# Patient Record
Sex: Female | Born: 1980 | Race: White | Hispanic: No | Marital: Married | Smoking: Never smoker
Health system: Southern US, Community
[De-identification: ages and names within clinical notes are randomized; demographics above are authoritative.]

## PROBLEM LIST (undated history)

## (undated) DIAGNOSIS — F40298 Other specified phobia: Secondary | ICD-10-CM

## (undated) DIAGNOSIS — S82001A Unspecified fracture of right patella, initial encounter for closed fracture: Secondary | ICD-10-CM

## (undated) DIAGNOSIS — T07XXXA Unspecified multiple injuries, initial encounter: Secondary | ICD-10-CM

## (undated) HISTORY — PX: SHOULDER ARTHROSCOPY: SHX128

---

## 2003-11-17 ENCOUNTER — Emergency Department (HOSPITAL_COMMUNITY): Admission: EM | Admit: 2003-11-17 | Discharge: 2003-11-17 | Payer: Self-pay | Admitting: Family Medicine

## 2008-03-15 ENCOUNTER — Emergency Department: Payer: Self-pay | Admitting: Emergency Medicine

## 2008-10-17 ENCOUNTER — Encounter: Admission: RE | Admit: 2008-10-17 | Discharge: 2008-11-15 | Payer: Self-pay | Admitting: Orthopedic Surgery

## 2009-04-03 ENCOUNTER — Encounter: Admission: RE | Admit: 2009-04-03 | Discharge: 2009-04-03 | Payer: Self-pay | Admitting: Orthopaedic Surgery
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
## 2012-07-17 ENCOUNTER — Emergency Department: Payer: Self-pay | Admitting: Emergency Medicine

## 2015-07-16 DIAGNOSIS — S82001A Unspecified fracture of right patella, initial encounter for closed fracture: Secondary | ICD-10-CM

## 2015-07-16 HISTORY — DX: Unspecified fracture of right patella, initial encounter for closed fracture: S82.001A

## 2015-08-02 ENCOUNTER — Encounter (HOSPITAL_BASED_OUTPATIENT_CLINIC_OR_DEPARTMENT_OTHER): Payer: Self-pay | Admitting: *Deleted

## 2015-08-02 DIAGNOSIS — T07XXXA Unspecified multiple injuries, initial encounter: Secondary | ICD-10-CM

## 2015-08-02 HISTORY — DX: Unspecified multiple injuries, initial encounter: T07.XXXA

## 2015-08-03 ENCOUNTER — Ambulatory Visit (HOSPITAL_BASED_OUTPATIENT_CLINIC_OR_DEPARTMENT_OTHER): Payer: BLUE CROSS/BLUE SHIELD | Admitting: Anesthesiology

## 2015-08-03 ENCOUNTER — Encounter (HOSPITAL_BASED_OUTPATIENT_CLINIC_OR_DEPARTMENT_OTHER)
Admission: RE | Disposition: A | Discharge status: Home / Self Care | Payer: Self-pay | Source: Ambulatory Visit | Attending: Orthopedic Surgery

## 2015-08-03 ENCOUNTER — Encounter (HOSPITAL_BASED_OUTPATIENT_CLINIC_OR_DEPARTMENT_OTHER): Payer: Self-pay | Admitting: *Deleted

## 2015-08-03 ENCOUNTER — Ambulatory Visit (HOSPITAL_BASED_OUTPATIENT_CLINIC_OR_DEPARTMENT_OTHER)
Admission: RE | Admit: 2015-08-03 | Discharge: 2015-08-03 | Disposition: A | Discharge status: Home / Self Care | Payer: BLUE CROSS/BLUE SHIELD | Source: Ambulatory Visit | Attending: Orthopedic Surgery | Admitting: Orthopedic Surgery

## 2015-08-03 DIAGNOSIS — W19XXXA Unspecified fall, initial encounter: Secondary | ICD-10-CM | POA: Insufficient documentation

## 2015-08-03 DIAGNOSIS — S82001A Unspecified fracture of right patella, initial encounter for closed fracture: Secondary | ICD-10-CM | POA: Diagnosis present

## 2015-08-03 DIAGNOSIS — Y9289 Other specified places as the place of occurrence of the external cause: Secondary | ICD-10-CM | POA: Insufficient documentation

## 2015-08-03 HISTORY — DX: Unspecified fracture of right patella, initial encounter for closed fracture: S82.001A

## 2015-08-03 HISTORY — DX: Unspecified multiple injuries, initial encounter: T07.XXXA

## 2015-08-03 HISTORY — DX: Other specified phobia: F40.298

## 2015-08-03 HISTORY — PX: ORIF PATELLA: SHX5033

## 2015-08-03 SURGERY — OPEN REDUCTION INTERNAL FIXATION (ORIF) PATELLA
Anesthesia: General | Site: Knee | Laterality: Right

## 2015-08-03 MED ORDER — OXYCODONE HCL 5 MG PO TABS
5.0000 mg | ORAL_TABLET | Freq: Once | ORAL | Status: AC
  Administered 2015-08-03: 5 mg via ORAL

## 2015-08-03 MED ORDER — DEXAMETHASONE SODIUM PHOSPHATE 10 MG/ML IJ SOLN
INTRAMUSCULAR | Status: DC | PRN
  Administered 2015-08-03: 10 mg via INTRAVENOUS

## 2015-08-03 MED ORDER — MEPERIDINE HCL 25 MG/ML IJ SOLN: 6.2500 mg | INTRAMUSCULAR | Status: DC | PRN

## 2015-08-03 MED ORDER — GLYCOPYRROLATE 0.2 MG/ML IJ SOLN: 0.2000 mg | Freq: Once | INTRAMUSCULAR | Status: DC | PRN

## 2015-08-03 MED ORDER — CEFAZOLIN SODIUM-DEXTROSE 2-3 GM-% IV SOLR
INTRAVENOUS | Status: AC
  Filled 2015-08-03: qty 50

## 2015-08-03 MED ORDER — PROPOFOL 10 MG/ML IV BOLUS
INTRAVENOUS | Status: DC | PRN
  Administered 2015-08-03: 200 mg via INTRAVENOUS

## 2015-08-03 MED ORDER — HYDROMORPHONE HCL 1 MG/ML IJ SOLN
INTRAMUSCULAR | Status: AC
  Filled 2015-08-03: qty 1

## 2015-08-03 MED ORDER — LIDOCAINE HCL (CARDIAC) 20 MG/ML IV SOLN
INTRAVENOUS | Status: DC | PRN
  Administered 2015-08-03: 80 mg via INTRAVENOUS

## 2015-08-03 MED ORDER — SCOPOLAMINE 1 MG/3DAYS TD PT72: 1.0000 | MEDICATED_PATCH | Freq: Once | TRANSDERMAL | Status: DC | PRN

## 2015-08-03 MED ORDER — BUPIVACAINE HCL (PF) 0.5 % IJ SOLN
INTRAMUSCULAR | Status: AC
  Filled 2015-08-03: qty 30

## 2015-08-03 MED ORDER — BUPIVACAINE-EPINEPHRINE (PF) 0.5% -1:200000 IJ SOLN
INTRAMUSCULAR | Status: AC
  Filled 2015-08-03: qty 30

## 2015-08-03 MED ORDER — LACTATED RINGERS IV SOLN: INTRAVENOUS | Status: DC

## 2015-08-03 MED ORDER — MIDAZOLAM HCL 2 MG/2ML IJ SOLN
1.0000 mg | INTRAMUSCULAR | Status: DC | PRN
  Administered 2015-08-03 (×2): 2 mg via INTRAVENOUS

## 2015-08-03 MED ORDER — DIAZEPAM 5 MG PO TABS: 5.0000 mg | ORAL_TABLET | Freq: Four times a day (QID) | ORAL | Status: DC | PRN

## 2015-08-03 MED ORDER — FENTANYL CITRATE (PF) 100 MCG/2ML IJ SOLN
INTRAMUSCULAR | Status: AC
  Filled 2015-08-03: qty 2

## 2015-08-03 MED ORDER — ONDANSETRON HCL 4 MG/2ML IJ SOLN
INTRAMUSCULAR | Status: DC | PRN
  Administered 2015-08-03: 4 mg via INTRAVENOUS

## 2015-08-03 MED ORDER — CEFAZOLIN SODIUM-DEXTROSE 2-3 GM-% IV SOLR
2.0000 g | INTRAVENOUS | Status: AC
  Administered 2015-08-03: 2 g via INTRAVENOUS

## 2015-08-03 MED ORDER — 0.9 % SODIUM CHLORIDE (POUR BTL) OPTIME
TOPICAL | Status: DC | PRN
  Administered 2015-08-03: 120 mL

## 2015-08-03 MED ORDER — BUPIVACAINE-EPINEPHRINE (PF) 0.5% -1:200000 IJ SOLN
INTRAMUSCULAR | Status: DC | PRN
  Administered 2015-08-03: 20 mL

## 2015-08-03 MED ORDER — PROMETHAZINE HCL 25 MG/ML IJ SOLN: 6.2500 mg | INTRAMUSCULAR | Status: DC | PRN

## 2015-08-03 MED ORDER — HYDROMORPHONE HCL 1 MG/ML IJ SOLN
0.2500 mg | INTRAMUSCULAR | Status: DC | PRN
  Administered 2015-08-03 (×4): 0.5 mg via INTRAVENOUS

## 2015-08-03 MED ORDER — OXYCODONE HCL 5 MG PO TABS
ORAL_TABLET | ORAL | Status: AC
  Filled 2015-08-03: qty 1

## 2015-08-03 MED ORDER — MIDAZOLAM HCL 2 MG/2ML IJ SOLN
INTRAMUSCULAR | Status: AC
  Filled 2015-08-03: qty 2

## 2015-08-03 MED ORDER — LACTATED RINGERS IV SOLN
INTRAVENOUS | Status: DC
  Administered 2015-08-03 (×2): via INTRAVENOUS

## 2015-08-03 MED ORDER — OXYCODONE HCL 5 MG PO TABS: 5.0000 mg | ORAL_TABLET | Freq: Once | ORAL | Status: DC

## 2015-08-03 MED ORDER — HYDROMORPHONE HCL 2 MG PO TABS
2.0000 mg | ORAL_TABLET | ORAL | Status: DC | PRN
Start: 2015-08-03 — End: 2018-11-28

## 2015-08-03 MED ORDER — SENNA-DOCUSATE SODIUM 8.6-50 MG PO TABS: 2.0000 | ORAL_TABLET | Freq: Every day | ORAL | Status: DC

## 2015-08-03 MED ORDER — BACLOFEN 10 MG PO TABS: 10.0000 mg | ORAL_TABLET | Freq: Three times a day (TID) | ORAL | Status: DC

## 2015-08-03 MED ORDER — FENTANYL CITRATE (PF) 100 MCG/2ML IJ SOLN
50.0000 ug | INTRAMUSCULAR | Status: AC | PRN
  Administered 2015-08-03: 25 ug via INTRAVENOUS
  Administered 2015-08-03: 50 ug via INTRAVENOUS
  Administered 2015-08-03: 100 ug via INTRAVENOUS
  Administered 2015-08-03: 50 ug via INTRAVENOUS
  Administered 2015-08-03: 25 ug via INTRAVENOUS

## 2015-08-03 MED ORDER — ONDANSETRON HCL 4 MG PO TABS: 4.0000 mg | ORAL_TABLET | Freq: Three times a day (TID) | ORAL | Status: DC | PRN

## 2015-08-03 MED ORDER — PROPOFOL 500 MG/50ML IV EMUL
INTRAVENOUS | Status: AC
  Filled 2015-08-03: qty 50

## 2015-08-03 MED ORDER — OXYCODONE-ACETAMINOPHEN 10-325 MG PO TABS: 1.0000 | ORAL_TABLET | Freq: Four times a day (QID) | ORAL | Status: DC | PRN

## 2015-08-03 MED ORDER — FENTANYL CITRATE (PF) 100 MCG/2ML IJ SOLN
INTRAMUSCULAR | Status: AC
  Filled 2015-08-03: qty 4

## 2015-08-03 SURGICAL SUPPLY — 76 items
BANDAGE ELASTIC 4 VELCRO ST LF (GAUZE/BANDAGES/DRESSINGS) ×3 IMPLANT
BANDAGE ELASTIC 6 VELCRO ST LF (GAUZE/BANDAGES/DRESSINGS) ×3 IMPLANT
BANDAGE ESMARK 6X9 LF (GAUZE/BANDAGES/DRESSINGS) ×1 IMPLANT
BIT DRILL CANN 2.7X625 NONSTRL (BIT) ×3 IMPLANT
BLADE SURG 15 STRL LF DISP TIS (BLADE) ×2 IMPLANT
BLADE SURG 15 STRL SS (BLADE) ×4
BNDG ESMARK 6X9 LF (GAUZE/BANDAGES/DRESSINGS) ×3
BNDG GAUZE ELAST 4 BULKY (GAUZE/BANDAGES/DRESSINGS) ×3 IMPLANT
CANISTER SUCT 1200ML W/VALVE (MISCELLANEOUS) ×3 IMPLANT
CLOSURE STERI-STRIP 1/2X4 (GAUZE/BANDAGES/DRESSINGS) ×1
CLSR STERI-STRIP ANTIMIC 1/2X4 (GAUZE/BANDAGES/DRESSINGS) ×2 IMPLANT
CUFF TOURNIQUET SINGLE 34IN LL (TOURNIQUET CUFF) ×3 IMPLANT
DECANTER SPIKE VIAL GLASS SM (MISCELLANEOUS) IMPLANT
DRAPE C-ARM 42X72 X-RAY (DRAPES) IMPLANT
DRAPE C-ARMOR (DRAPES) IMPLANT
DRAPE EXTREMITY T 121X128X90 (DRAPE) ×3 IMPLANT
DRAPE INCISE IOBAN 66X45 STRL (DRAPES) IMPLANT
DRAPE OEC MINIVIEW 54X84 (DRAPES) ×3 IMPLANT
DRAPE U-SHAPE 47X51 STRL (DRAPES) ×3 IMPLANT
DRSG PAD ABDOMINAL 8X10 ST (GAUZE/BANDAGES/DRESSINGS) ×15 IMPLANT
DURAPREP 26ML APPLICATOR (WOUND CARE) ×3 IMPLANT
ELECT REM PT RETURN 9FT ADLT (ELECTROSURGICAL) ×3
ELECTRODE REM PT RTRN 9FT ADLT (ELECTROSURGICAL) ×1 IMPLANT
GAUZE SPONGE 4X4 12PLY STRL (GAUZE/BANDAGES/DRESSINGS) ×3 IMPLANT
GLOVE BIO SURGEON STRL SZ8 (GLOVE) ×3 IMPLANT
GLOVE BIOGEL M STRL SZ7.5 (GLOVE) ×3 IMPLANT
GLOVE BIOGEL PI IND STRL 8 (GLOVE) ×3 IMPLANT
GLOVE BIOGEL PI INDICATOR 8 (GLOVE) ×6
GLOVE EXAM NITRILE MD LF STRL (GLOVE) ×3 IMPLANT
GLOVE ORTHO TXT STRL SZ7.5 (GLOVE) ×3 IMPLANT
GOWN STRL REUS W/ TWL LRG LVL3 (GOWN DISPOSABLE) IMPLANT
GOWN STRL REUS W/ TWL XL LVL3 (GOWN DISPOSABLE) ×3 IMPLANT
GOWN STRL REUS W/TWL LRG LVL3 (GOWN DISPOSABLE)
GOWN STRL REUS W/TWL XL LVL3 (GOWN DISPOSABLE) ×6
GUIDEWARE NON THREAD 1.25X150 (WIRE) ×6
GUIDEWIRE NON THREAD 1.25X150 (WIRE) ×2 IMPLANT
IMMOBILIZER KNEE 22 UNIV (SOFTGOODS) IMPLANT
IMMOBILIZER KNEE 24 THIGH 36 (MISCELLANEOUS) IMPLANT
IMMOBILIZER KNEE 24 UNIV (MISCELLANEOUS)
NEEDLE KEITH (NEEDLE) ×3 IMPLANT
NS IRRIG 1000ML POUR BTL (IV SOLUTION) ×3 IMPLANT
PACK ARTHROSCOPY DSU (CUSTOM PROCEDURE TRAY) ×3 IMPLANT
PACK BASIN DAY SURGERY FS (CUSTOM PROCEDURE TRAY) ×3 IMPLANT
PAD CAST 4YDX4 CTTN HI CHSV (CAST SUPPLIES) ×1 IMPLANT
PADDING CAST ABS 4INX4YD NS (CAST SUPPLIES) ×10
PADDING CAST ABS 6INX4YD NS (CAST SUPPLIES) ×4
PADDING CAST ABS COTTON 4X4 ST (CAST SUPPLIES) ×5 IMPLANT
PADDING CAST ABS COTTON 6X4 NS (CAST SUPPLIES) ×2 IMPLANT
PADDING CAST COTTON 4X4 STRL (CAST SUPPLIES) ×2
PENCIL BUTTON HOLSTER BLD 10FT (ELECTRODE) ×3 IMPLANT
SCREW CANN L THRD/30 4.0 (Screw) ×6 IMPLANT
SLEEVE SCD COMPRESS KNEE MED (MISCELLANEOUS) ×3 IMPLANT
SPLINT FAST PLASTER 5X30 (CAST SUPPLIES) ×60
SPLINT PLASTER CAST FAST 5X30 (CAST SUPPLIES) ×30 IMPLANT
SPONGE LAP 18X18 X RAY DECT (DISPOSABLE) ×3 IMPLANT
STAPLER VISISTAT 35W (STAPLE) IMPLANT
SUCTION FRAZIER TIP 10 FR DISP (SUCTIONS) ×3 IMPLANT
SUT ETHILON 3 0 PS 1 (SUTURE) IMPLANT
SUT ETHILON 4 0 PS 2 18 (SUTURE) IMPLANT
SUT FIBERWIRE #2 38 T-5 BLUE (SUTURE) ×6
SUT FIBERWIRE #5 38 CONV NDL (SUTURE)
SUT MNCRL AB 4-0 PS2 18 (SUTURE) IMPLANT
SUT VIC AB 0 CT1 27 (SUTURE) ×2
SUT VIC AB 0 CT1 27XBRD ANBCTR (SUTURE) ×1 IMPLANT
SUT VIC AB 2-0 SH 18 (SUTURE) IMPLANT
SUT VIC AB 2-0 SH 27 (SUTURE)
SUT VIC AB 2-0 SH 27XBRD (SUTURE) IMPLANT
SUT VIC AB 3-0 SH 27 (SUTURE)
SUT VIC AB 3-0 SH 27X BRD (SUTURE) IMPLANT
SUT VICRYL 3-0 CR8 SH (SUTURE) ×3 IMPLANT
SUT VICRYL 4-0 PS2 18IN ABS (SUTURE) IMPLANT
SUTURE FIBERWR #2 38 T-5 BLUE (SUTURE) ×2 IMPLANT
SUTURE FIBERWR #5 38 CONV NDL (SUTURE) IMPLANT
SYR BULB 3OZ (MISCELLANEOUS) ×3 IMPLANT
UNDERPAD 30X30 (UNDERPADS AND DIAPERS) ×3 IMPLANT
YANKAUER SUCT BULB TIP NO VENT (SUCTIONS) IMPLANT

## 2015-08-03 NOTE — Progress Notes (Signed)
Assisted Dr. Hollis with right, ultrasound guided, femoral block. Side rails up, monitors on throughout procedure. See vital signs in flow sheet. Tolerated Procedure well. 

## 2015-08-03 NOTE — Transfer of Care (Signed)
Immediate Anesthesia Transfer of Care Note  Patient: Mary Schneider  Procedure(s) Performed: Procedure(s): OPEN REDUCTION INTERNAL (ORIF) FIXATION RIGHT PATELLA (Right)  Patient Location: PACU  Anesthesia Type:GA combined with regional for post-op pain  Level of Consciousness: sedated  Airway & Oxygen Therapy: Patient Spontanous Breathing and Patient connected to face mask oxygen  Post-op Assessment: Report given to RN and Post -op Vital signs reviewed and stable  Post vital signs: Reviewed and stable  Last Vitals:  Filed Vitals:   08/03/15 1415  BP:   Pulse: 80  Temp:   Resp: 38    Complications: No apparent anesthesia complications

## 2015-08-03 NOTE — H&P (Signed)
PREOPERATIVE H&P  Chief Complaint: Right knee pain  HPI: Mary Schneider is a 34 y.o. female who presents for preoperative history and physical with a diagnosis of Right fracture of patella, initial encounter for closed fracture S82.009A. Symptoms are rated as moderate to severe, and have been worsening.  This is significantly impairing activities of daily living.  She has elected for surgical management. This happened after she had a fall, fell directly onto the anterior knee. Unable left leg. Significant pain. Denies any other major injuries. This was after getting her nails done. She was apparently outside the store and tripped on a broken piece of pavement.  Past Medical History  Diagnosis Date  . Right patella fracture 07/2015  . Needle phobia   . Abrasions of multiple sites 08/02/2015    left knee, left ankle   Past Surgical History  Procedure Laterality Date  . Shoulder arthroscopy Right    Social History   Social History  . Marital Status: Married    Spouse Name: N/A  . Number of Children: N/A  . Years of Education: N/A   Social History Main Topics  . Smoking status: Never Smoker   . Smokeless tobacco: Never Used  . Alcohol Use: No  . Drug Use: No  . Sexual Activity: Not Asked   Other Topics Concern  . None   Social History Narrative  . None   History reviewed. No pertinent family history. No Known Allergies Prior to Admission medications   Medication Sig Start Date End Date Taking? Authorizing Provider  oxyCODONE-acetaminophen (PERCOCET) 10-325 MG per tablet Take 1 tablet by mouth every 4 (four) hours as needed for pain.   Yes Historical Provider, MD     Positive ROS: All other systems have been reviewed and were otherwise negative with the exception of those mentioned in the HPI and as above.  Physical Exam: General: Alert, no acute distress Cardiovascular: No pedal edema Respiratory: No cyanosis, no use of accessory musculature GI: No organomegaly,  abdomen is soft and non-tender Skin: No lesions in the area of chief complaint Neurologic: Sensation intact distally Psychiatric: Patient is competent for consent with normal mood and affect Lymphatic: No axillary or cervical lymphadenopathy  MUSCULOSKELETAL: Right leg has positive pain to palpation over the patella, with complete loss of extensor mechanism. Sensation intact distally.  Assessment: Acute displaced right patella fracture   Plan: Plan for Procedure(s): OPEN REDUCTION INTERNAL (ORIF) FIXATION RIGHT PATELLA  The risks benefits and alternatives were discussed with the patient including but not limited to the risks of nonoperative treatment, versus surgical intervention including infection, bleeding, nerve injury, malunion, nonunion, the need for revision surgery, hardware prominence, hardware failure, the need for hardware removal, blood clots, cardiopulmonary complications, morbidity, mortality, among others, and they were willing to proceed.     Eulas Post, MD Cell [phone]   08/03/2015 10:14 AM

## 2015-08-03 NOTE — Discharge Instructions (Signed)
Diet: As you were doing prior to hospitalization   Shower:  May shower but keep the wounds dry, use an occlusive plastic wrap, NO SOAKING IN TUB.  If the bandage gets wet, change with a clean dry gauze.  If you have a splint on, leave the splint in place and keep the splint dry with a plastic bag.  Dressing:  You may change your dressing 3-5 days after surgery, unless you have a splint.  If you have a splint, then just leave the splint in place and we will change your bandages during your first follow-up appointment.    If you had hand or foot surgery, we will plan to remove your stitches in about 2 weeks in the office.  For all other surgeries, there are sticky tapes (steri-strips) on your wounds and all the stitches are absorbable.  Leave the steri-strips in place when changing your dressings, they will peel off with time, usually 2-3 weeks.  Activity:  Increase activity slowly as tolerated, but follow the weight bearing instructions below.  The rules on driving is that you can not be taking narcotics while you drive, and you must feel in control of the vehicle.    Weight Bearing:   Touch toe weight bearing, keep leg in full extension..    To prevent constipation: you may use a stool softener such as -  Colace (over the counter) 100 mg by mouth twice a day  Drink plenty of fluids (prune juice may be helpful) and high fiber foods Miralax (over the counter) for constipation as needed.    Itching:  If you experience itching with your medications, try taking only a single pain pill, or even half a pain pill at a time.  You may take up to 10 pain pills per day, and you can also use benadryl over the counter for itching or also to help with sleep.   Precautions:  If you experience chest pain or shortness of breath - call 911 immediately for transfer to the hospital emergency department!!  If you develop a fever greater that 101 F, purulent drainage from wound, increased redness or drainage from  wound, or calf pain -- Call the office at [phone]                                                Follow- Up Appointment:  Please call for an appointment to be seen in 2 weeks [place] - [phone]      Post Anesthesia Home Care Instructions  Activity: Get plenty of rest for the remainder of the day. A responsible adult should stay with you for 24 hours following the procedure.  For the next 24 hours, DO NOT: -Drive a car -Advertising copywriter -Drink alcoholic beverages -Take any medication unless instructed by your physician -Make any legal decisions or sign important papers.  Meals: Start with liquid foods such as gelatin or soup. Progress to regular foods as tolerated. Avoid greasy, spicy, heavy foods. If nausea and/or vomiting occur, drink only clear liquids until the nausea and/or vomiting subsides. Call your physician if vomiting continues.  Special Instructions/Symptoms: Your throat may feel dry or sore from the anesthesia or the breathing tube placed in your throat during surgery. If this causes discomfort, gargle with warm salt water. The discomfort should disappear within 24 hours.  If you had a scopolamine patch  placed behind your ear for the management of post- operative nausea and/or vomiting:  1. The medication in the patch is effective for 72 hours, after which it should be removed.  Wrap patch in a tissue and discard in the trash. Wash hands thoroughly with soap and water. 2. You may remove the patch earlier than 72 hours if you experience unpleasant side effects which may include dry mouth, dizziness or visual disturbances. 3. Avoid touching the patch. Wash your hands with soap and water after contact with the patch.    Regional Anesthesia Blocks  1. Numbness or the inability to move the "blocked" extremity may last from 3-48 hours after placement. The length of time depends on the medication injected and your individual response to the medication. If the  numbness is not going away after 48 hours, call your surgeon.  2. The extremity that is blocked will need to be protected until the numbness is gone and the  Strength has returned. Because you cannot feel it, you will need to take extra care to avoid injury. Because it may be weak, you may have difficulty moving it or using it. You may not know what position it is in without looking at it while the block is in effect.  3. For blocks in the legs and feet, returning to weight bearing and walking needs to be done carefully. You will need to wait until the numbness is entirely gone and the strength has returned. You should be able to move your leg and foot normally before you try and bear weight or walk. You will need someone to be with you when you first try to ensure you do not fall and possibly risk injury.  4. Bruising and tenderness at the needle site are common side effects and will resolve in a few days.  5. Persistent numbness or new problems with movement should be communicated to the surgeon or the [facility] [phone] [facility] [phone]).

## 2015-08-03 NOTE — Anesthesia Preprocedure Evaluation (Addendum)
Anesthesia Evaluation  Patient identified by MRN, date of birth, ID band Patient awake    Reviewed: Allergy & Precautions, NPO status , Patient's Chart, lab work & pertinent test results  Airway Mallampati: II  TM Distance: >3 FB Neck ROM: Full    Dental  (+) Teeth Intact   Pulmonary neg pulmonary ROS,  breath sounds clear to auscultation        Cardiovascular Exercise Tolerance: Good Rhythm:Regular Rate:Normal     Neuro/Psych PSYCHIATRIC DISORDERS Anxiety negative neurological ROS     GI/Hepatic negative GI ROS, Neg liver ROS,   Endo/Other  negative endocrine ROS  Renal/GU negative Renal ROS  negative genitourinary   Musculoskeletal negative musculoskeletal ROS (+)   Abdominal   Peds negative pediatric ROS (+)  Hematology negative hematology ROS (+)   Anesthesia Other Findings   Reproductive/Obstetrics negative OB ROS                            Anesthesia Physical Anesthesia Plan  ASA: I  Anesthesia Plan: General   Post-op Pain Management: GA combined w/ Regional for post-op pain   Induction: Intravenous  Airway Management Planned: LMA  Additional Equipment:   Intra-op Plan:   Post-operative Plan: Extubation in OR  Informed Consent: I have reviewed the patients History and Physical, chart, labs and discussed the procedure including the risks, benefits and alternatives for the proposed anesthesia with the patient or authorized representative who has indicated his/her understanding and acceptance.   Dental advisory given  Plan Discussed with: CRNA  Anesthesia Plan Comments:         Anesthesia Quick Evaluation

## 2015-08-03 NOTE — Anesthesia Procedure Notes (Addendum)
Anesthesia Regional Block:  Femoral nerve block  Pre-Anesthetic Checklist: ,, timeout performed, Correct Patient, Correct Site, Correct Laterality, Correct Procedure, Correct Position, site marked, Risks and benefits discussed,  Surgical consent,  Pre-op evaluation,  At surgeon's request and post-op pain management  Laterality: Right  Prep: chloraprep       Needles:  Injection technique: Single-shot  Needle Type: Echogenic Needle     Needle Length: 9cm 9 cm Needle Gauge: 21 and 21 G    Additional Needles:  Procedures: ultrasound guided (picture in chart) Femoral nerve block Narrative:  Start time: 08/03/2015 7:15 AM End time: 08/03/2015 7:21 AM Injection made incrementally with aspirations every 5 mL.  Performed by: Personally  Anesthesiologist: Cyndie Chime   Procedure Name: LMA Insertion Date/Time: 08/03/2015 12:23 PM Performed by: Burna Cash Pre-anesthesia Checklist: Patient identified, Emergency Drugs available, Suction available and Patient being monitored Patient Re-evaluated:Patient Re-evaluated prior to inductionOxygen Delivery Method: Circle System Utilized Preoxygenation: Pre-oxygenation with 100% oxygen Intubation Type: IV induction Ventilation: Mask ventilation without difficulty LMA: LMA inserted LMA Size: 4.0 Number of attempts: 1 Airway Equipment and Method: Bite block Placement Confirmation: positive ETCO2 Tube secured with: Tape Dental Injury: Teeth and Oropharynx as per pre-operative assessment

## 2015-08-03 NOTE — Op Note (Signed)
08/03/2015  2:02 PM  PATIENT:  Mary Schneider    PRE-OPERATIVE DIAGNOSIS:  Displaced right patella fracture, closed  POST-OPERATIVE DIAGNOSIS:  Same  PROCEDURE:  OPEN REDUCTION INTERNAL (ORIF) FIXATION RIGHT PATELLA  SURGEON:  Eulas Post, MD  PHYSICIAN ASSISTANT: Janace Litten, OPA-C, present and scrubbed throughout the case, critical for completion in a timely fashion, and for retraction, instrumentation, and closure.  ANESTHESIA:   General  PREOPERATIVE INDICATIONS:  Mary Schneider is a  34 y.o. female who fell outside of a nail salon on a broken piece of concrete and had a displaced right patella fracture, and elected for surgical management in order to restore the function of the extensor mechanism.    The risks benefits and alternatives were discussed with the patient preoperatively including but not limited to the risks of infection, bleeding, nerve injury, cardiopulmonary complications, the need for revision surgery, hardware prominence, hardware failure, the need for hardware removal, nonunion, malunion, posttraumatic arthritis, stiffness, loss of strength and function, among others, and the patient was willing to proceed.  OPERATIVE IMPLANTS: 4.0 mm cannulated screws x2 with a total of 2 #2 FiberWire going through the cannulated screws in a figure-of-eight cerclage fashion  OPERATIVE FINDINGS: Displaced patella fracture  OPERATIVE PROCEDURE: The patient was brought to the operating room and placed in the supine position. General anesthesia was administered. IV antibiotics were given. The lower extremity was prepped and draped in usual sterile fashion. The leg was elevated and exsanguinated and the tourniquet was inflated. Time out was performed.   Anterior incision was made over the patella and the fracture fragments identified and cleaned of hematoma. The retinaculum was actually not torn. There was a sagittal split, as well as a transverse split. I held each fracture  plane with a different clamp.  I placed 2 guidewires for the cannulated screws.  The lengths were measured, after being confirmed on C-arm, and then I placed the screws, taking care to make sure that there were threads only on the proximal segment, providing compression at the fracture site, and the tips were not prominent proximally.  C-arm used to confirm reduction and position of the screws, and once I was satisfied with this I then used a Keith needle through the screws bringing a total of 2 #2 FiberWire in a figure-of-eight type fashion. This provided excellent secondary fixation. I left the screw lengths slightly short of the far cortex in order to minimize the risk for rupture of the FiberWire over the tip of the screws.  The anterior patellar tissue was repaired with Vicryl, again there was no significant retinacular tearing.   The wounds were irrigated copiously and the retinaculum repaired with Vicryl followed by Vicryl for the subcutaneous tissue with Steri-Strips and sterile gauze for the skin. The wounds were also injectea long leg splint was applied, and the patient was awakened and returned to the PACU in stable and satisfactory condition.There were no complications.

## 2015-08-03 NOTE — Anesthesia Postprocedure Evaluation (Signed)
  Anesthesia Post-op Note  Patient: Mary Schneider  Procedure(s) Performed: Procedure(s): OPEN REDUCTION INTERNAL (ORIF) FIXATION RIGHT PATELLA (Right)  Patient Location: PACU  Anesthesia Type:General  Level of Consciousness: awake and alert   Airway and Oxygen Therapy: Patient Spontanous Breathing  Post-op Pain: moderate  Post-op Assessment: Post-op Vital signs reviewed and Patient's Cardiovascular Status Stable     RLE Motor Response: Purposeful movement RLE Sensation: Numbness      Post-op Vital Signs: Reviewed and stable  Last Vitals:  Filed Vitals:   08/03/15 1505  BP: 130/93  Pulse: 95  Temp: 37 C  Resp: 14    Complications: No apparent anesthesia complications

## 2015-08-07 ENCOUNTER — Encounter (HOSPITAL_BASED_OUTPATIENT_CLINIC_OR_DEPARTMENT_OTHER): Payer: Self-pay | Admitting: Orthopedic Surgery

## 2018-11-28 ENCOUNTER — Encounter: Payer: Self-pay | Admitting: Emergency Medicine

## 2018-11-28 ENCOUNTER — Ambulatory Visit (INDEPENDENT_AMBULATORY_CARE_PROVIDER_SITE_OTHER): Payer: BLUE CROSS/BLUE SHIELD

## 2018-11-28 ENCOUNTER — Ambulatory Visit
Admission: EM | Admit: 2018-11-28 | Discharge: 2018-11-28 | Disposition: A | Discharge status: Home / Self Care | Payer: BLUE CROSS/BLUE SHIELD | Attending: Emergency Medicine | Admitting: Emergency Medicine

## 2018-11-28 ENCOUNTER — Other Ambulatory Visit: Payer: Self-pay

## 2018-11-28 DIAGNOSIS — S2242XA Multiple fractures of ribs, left side, initial encounter for closed fracture: Secondary | ICD-10-CM | POA: Insufficient documentation

## 2018-11-28 DIAGNOSIS — R071 Chest pain on breathing: Secondary | ICD-10-CM | POA: Diagnosis not present

## 2018-11-28 MED ORDER — ACETAMINOPHEN 500 MG PO TABS
1000.0000 mg | ORAL_TABLET | Freq: Once | ORAL | Status: AC
  Administered 2018-11-28: 1000 mg via ORAL

## 2018-11-28 MED ORDER — HYDROCODONE-ACETAMINOPHEN 5-325 MG PO TABS: 1.0000 | ORAL_TABLET | Freq: Four times a day (QID) | ORAL | 0 refills | Status: AC | PRN

## 2018-11-28 MED ORDER — IBUPROFEN 600 MG PO TABS: 600.0000 mg | ORAL_TABLET | Freq: Four times a day (QID) | ORAL | 0 refills | Status: AC | PRN

## 2018-11-28 MED ORDER — KETOROLAC TROMETHAMINE 60 MG/2ML IM SOLN
30.0000 mg | Freq: Once | INTRAMUSCULAR | Status: AC
  Administered 2018-11-28: 30 mg via INTRAMUSCULAR

## 2018-11-28 NOTE — Discharge Instructions (Signed)
600 mg of ibuprofen combined with [place] containing product 3 or 4 times a day as needed for pain.  Either the ibuprofen/1000 mg of regular Tylenol for mild to moderate pain or ibuprofen/1-2 Norco for severe pain.  Do not take the Tylenol and Norco as they both have Tylenol in them and too much can hurt your liver.  This will take 4 to 6 weeks to completely heal.  Use the incentive spirometer as often as you remember to do so to help prevent a pneumonia.

## 2018-11-28 NOTE — ED Provider Notes (Signed)
HPI  SUBJECTIVE:  Mary Schneider is a 37 y.o. female who presents with sharp intermittent left-sided rib pain for the past week.  States that she was playing with her nephew, who fell on top of her landing on her posterior mid thoracic area with his knee.  She states the pain is located in back and in front.  She states it is sharp with movement, inspiration.  She denies feeling a pop or hearing a snap, no shortness of breath, hemoptysis, cough, bruising, fevers.  She tried Aleve, etodolac without improvement in her symptoms.  Symptoms are worse with inspiration, torso rotation, coughing, sneezing.  Past medical history negative for osteoporosis.  LMP: 2 weeks ago.  Denies the possibility of being pregnant.  PMD: Does not remember.  [place]    Past Medical History:  Diagnosis Date  . Abrasions of multiple sites 08/02/2015   left knee, left ankle  . Needle phobia   . Right patella fracture 07/2015    Past Surgical History:  Procedure Laterality Date  . ORIF PATELLA Right 08/03/2015   Procedure: OPEN REDUCTION INTERNAL (ORIF) FIXATION RIGHT PATELLA;  Surgeon: Teryl LucyJoshua Landau, MD;  Location: [facility];  Service: Orthopedics;  Laterality: Right;  . SHOULDER ARTHROSCOPY Right     History reviewed. No pertinent family history.  Social History   Tobacco Use  . Smoking status: Never Smoker  . Smokeless tobacco: Never Used  Substance Use Topics  . Alcohol use: No  . Drug use: Yes    Types: Marijuana    Comment: last night 11/27/18    No current facility-administered medications for this encounter.   Current Outpatient Medications:  .  HYDROcodone-acetaminophen (NORCO/VICODIN) 5-325 MG tablet, Take 1-2 tablets by mouth every 6 (six) hours as needed for moderate pain or severe pain., Disp: 15 tablet, Rfl: 0 .  ibuprofen (ADVIL,MOTRIN) 600 MG tablet, Take 1 tablet (600 mg total) by mouth every 6 (six) hours as needed., Disp: 30 tablet, Rfl:  0  No Known Allergies   ROS  As noted in HPI.   Physical Exam  BP (!) 146/107 (BP Location: Left Arm)   Pulse (!) 102   Temp 98.2 F (36.8 C) (Oral)   Resp 18   Ht 5\' 6"  (1.676 m)   Wt 78.5 kg   LMP 11/14/2018   SpO2 100%   BMI 27.92 kg/m   Constitutional: Well developed, well nourished, no acute distress Eyes:  EOMI, conjunctiva normal bilaterally HENT: Normocephalic, atraumatic,mucus membranes moist Respiratory: Normal inspiratory effort, lungs clear bilaterally, good air movement.  Left chest: No bruising.  No crepitus anteriorly or posteriorly.  Positive tenderness along ribs #6, 7 anteriorly and posteriorly.  No paradoxical chest wall movement. Cardiovascular: Mild regular tachycardia, no murmurs, rubs, gallops GI: nondistended skin: No rash, skin intact Musculoskeletal: no deformities Neurologic: Alert & oriented x 3, no focal neuro deficits Psychiatric: Speech and behavior appropriate   ED Course   Medications  ketorolac (TORADOL) injection 30 mg (30 mg Intramuscular Given 11/28/18 1436)  acetaminophen (TYLENOL) tablet 1,000 mg (1,000 mg Oral Given 11/28/18 1436)    Orders Placed This Encounter  Procedures  . DG Ribs Unilateral W/Chest Left    Standing Status:   Standing    Number of Occurrences:   1    Order Specific Question:   Reason for Exam (SYMPTOM  OR DIAGNOSIS REQUIRED)    Answer:   trauma tenderness 6,7,8th ridbs r/o ptx, fx    No results  found for this or any previous visit (from the past 24 hour(s)). Dg Ribs Unilateral W/chest Left  Result Date: 11/28/2018 CLINICAL DATA:  Difficulty breathing.  Left-sided chest pain. EXAM: LEFT RIBS AND CHEST - 3+ VIEW COMPARISON:  None. FINDINGS: Normal cardiac silhouette and mediastinal contours. No focal parenchymal opacities. No pleural effusion or pneumothorax. No evidence of edema. No definite displaced left-sided rib fractures with special attention paid to the area demarcated by the radiopaque BBs.  Regional soft tissues appear normal.  No radiopaque foreign body. IMPRESSION: No acute cardiopulmonary disease. Specifically, no definite displaced left-sided rib fractures special attention paid to the area demarcated by the radiopaque BBs. Electronically Signed   By: Simonne Come M.D.   On: 11/28/2018 14:54    ED Clinical Impression  Closed fracture of multiple ribs of left side, initial encounter   ED Assessment/Plan  Checking left rib series.  Suspect left sided rib fractures.  Giving Toradol 30 mg IM and Tylenol 1000 mg p.o.  [place] Narc[place] database reviewed for this patient, and feel that the risk/benefit ratio today is favorable for proceeding with a prescription for controlled substance.  No Opiate prescriptions since 11/2017  Reviewed imaging independently.  No definite displaced left-sided rib fractures.  Pneumothorax, pleural effusion.  See radiology report for full details.  Presentation most consistent with rib fractures given point tenderness, however she does not have any displaced rib fractures on x-ray. she has no pneumothorax at this point in time.  States that she feels better after the Toradol, Tylenol.  Sending home with ibuprofen 600 mg with 1 g of Tylenol together 3 or 4 times a day as needed for pain, or ibuprofen/Norco for severe pain.  Wrote a prescription for an incentive spirometer as well.  Follow-up with PMD as needed, to the ER if she gets worse.  Discussed  imaging, MDM, treatment plan, and plan for follow-up with patient. Discussed sn/sx that should prompt return to the ED. patient agrees with plan.   Meds ordered this encounter  Medications  . ketorolac (TORADOL) injection 30 mg  . acetaminophen (TYLENOL) tablet 1,000 mg  . ibuprofen (ADVIL,MOTRIN) 600 MG tablet    Sig: Take 1 tablet (600 mg total) by mouth every 6 (six) hours as needed.    Dispense:  30 tablet    Refill:  0  . HYDROcodone-acetaminophen (NORCO/VICODIN) 5-325 MG tablet    Sig: Take 1-2  tablets by mouth every 6 (six) hours as needed for moderate pain or severe pain.    Dispense:  15 tablet    Refill:  0    *This clinic note was created using Scientist, clinical (histocompatibility and immunogenetics). Therefore, there may be occasional mistakes despite careful proofreading.   ?   Domenick Gong, MD 11/28/18 1734

## 2018-11-28 NOTE — ED Triage Notes (Signed)
Patient c/o musculoskeletal pain on the left side x 1 week after her nephew fell on her. Patient c/o left side rib pain.

## 2019-11-28 IMAGING — CR DG RIBS W/ CHEST 3+V*L*
3 series · 3 of 3 positions shown · non-contrast
Comparison: None.

CLINICAL DATA: Difficulty breathing.  Left-sided chest pain.

EXAM:
LEFT RIBS AND CHEST - 3+ VIEW

[chest pa]
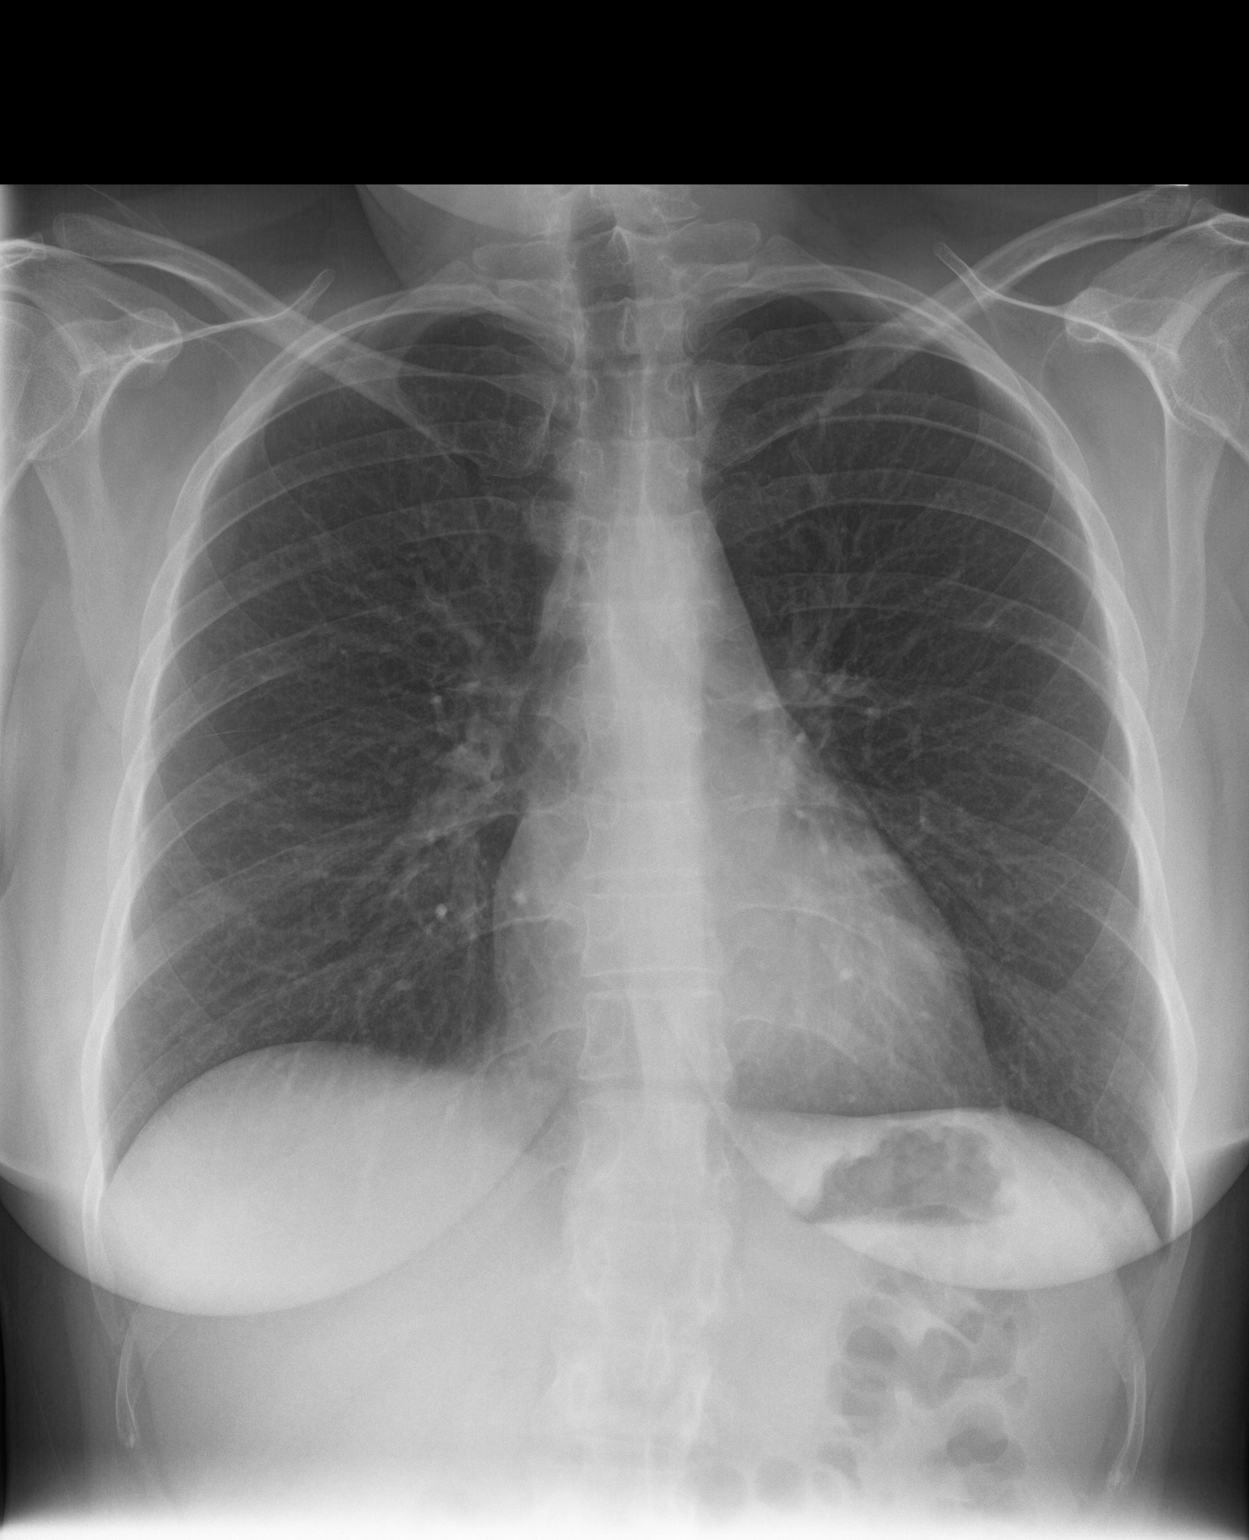

[rib pa]
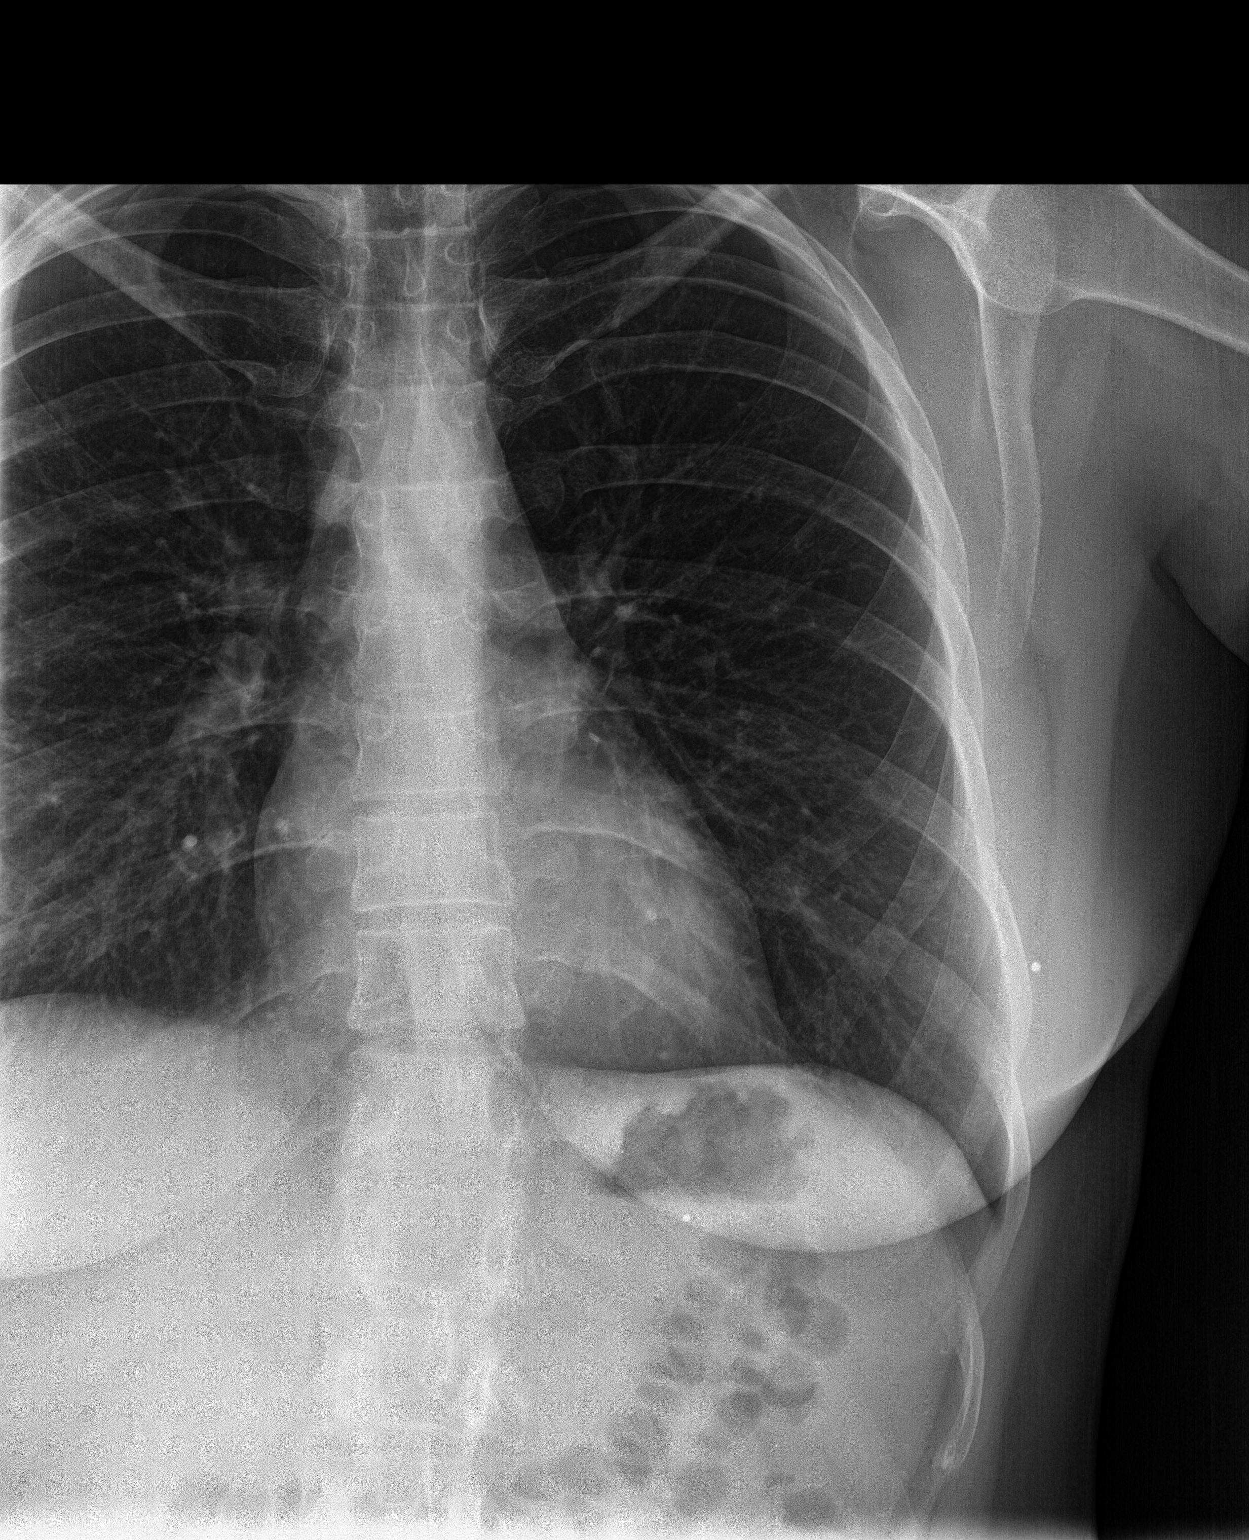

[rib obl]
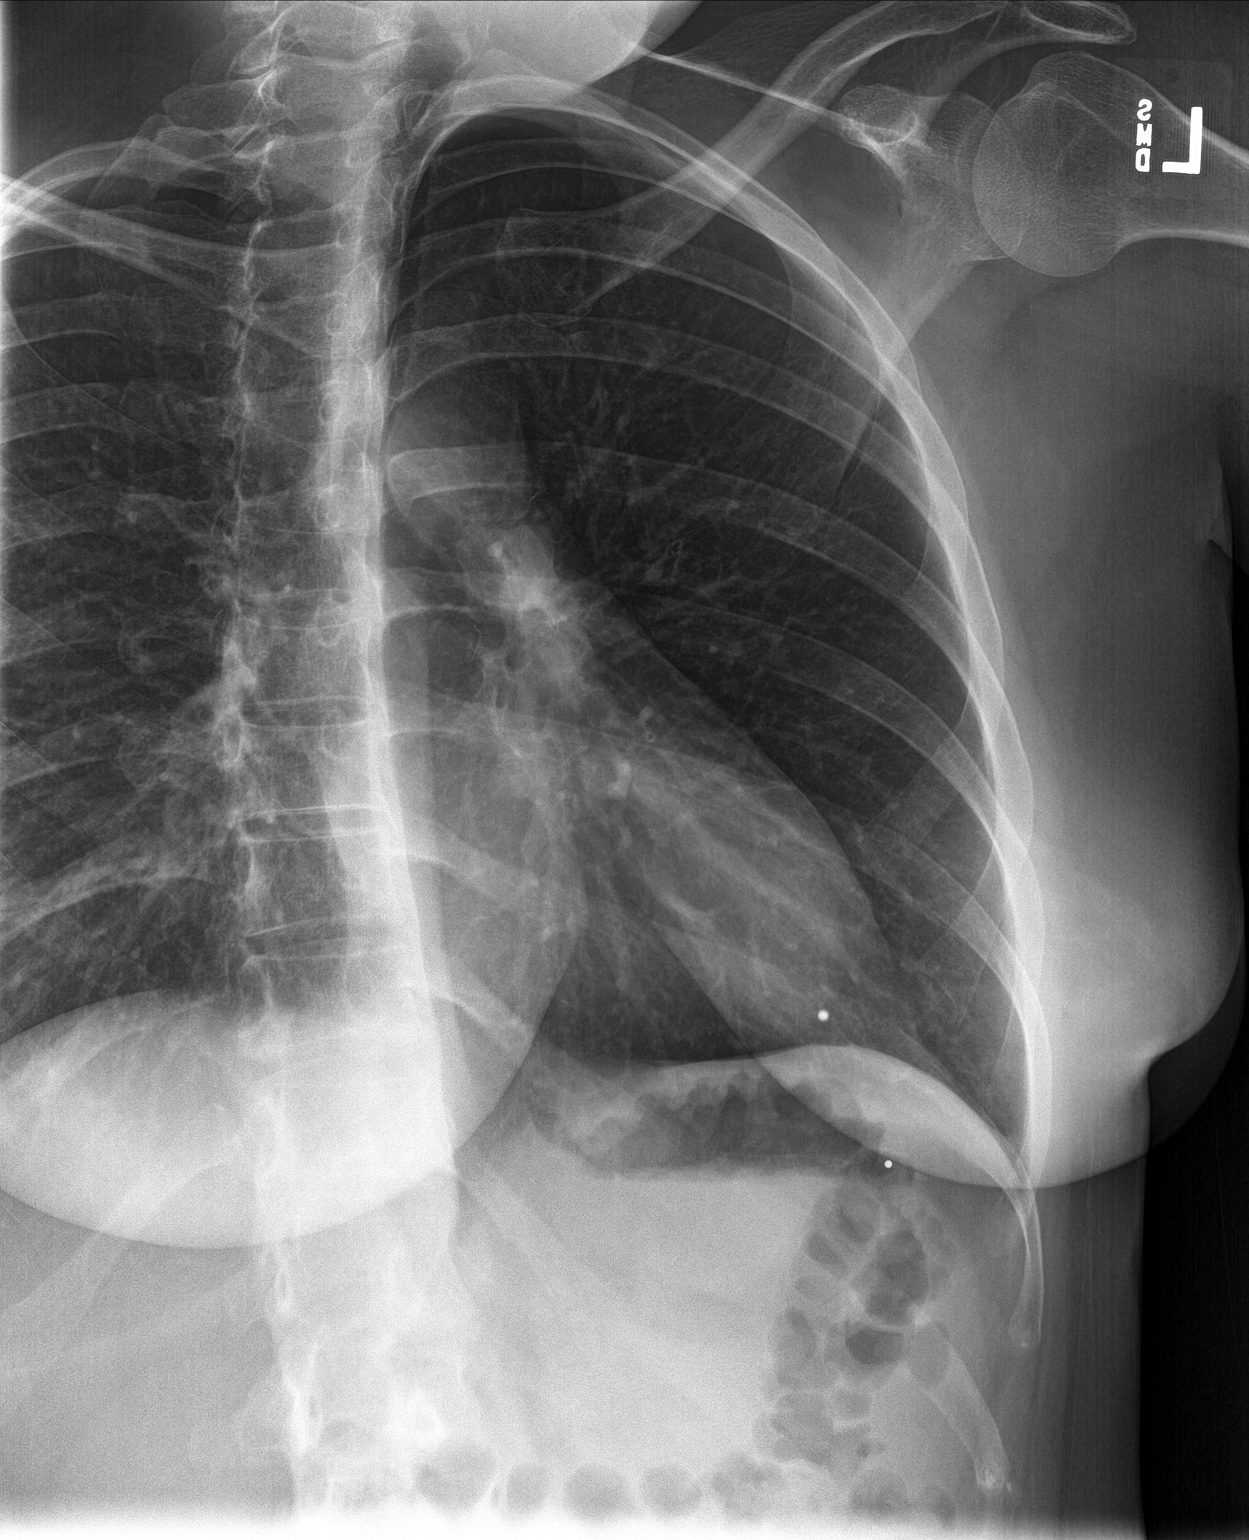

[3 of 3 positions shown; findings below may reference images not displayed]

FINDINGS: Normal cardiac silhouette and mediastinal contours. No focal
parenchymal opacities. No pleural effusion or pneumothorax. No
evidence of edema.

No definite displaced left-sided rib fractures with special
attention paid to the area demarcated by the radiopaque BBs.

Regional soft tissues appear normal.  No radiopaque foreign body.
IMPRESSION: No acute cardiopulmonary disease. Specifically, no definite
displaced left-sided rib fractures special attention paid to the
area demarcated by the radiopaque BBs.
